# Patient Record
Sex: Female | Born: 1995 | Race: White | Hispanic: No | Marital: Single | Smoking: Never smoker
Health system: Southern US, Community
[De-identification: ages and names within clinical notes are randomized; demographics above are authoritative.]

## PROBLEM LIST (undated history)

## (undated) DIAGNOSIS — F909 Attention-deficit hyperactivity disorder, unspecified type: Secondary | ICD-10-CM

## (undated) HISTORY — PX: WISDOM TOOTH EXTRACTION: SHX21

---

## 2017-03-06 ENCOUNTER — Emergency Department (HOSPITAL_COMMUNITY): Payer: 59

## 2017-03-06 ENCOUNTER — Emergency Department (HOSPITAL_COMMUNITY)
Admission: EM | Admit: 2017-03-06 | Discharge: 2017-03-06 | Disposition: A | Discharge status: Home / Self Care | Payer: 59 | Attending: Emergency Medicine | Admitting: Emergency Medicine

## 2017-03-06 ENCOUNTER — Encounter (HOSPITAL_COMMUNITY): Payer: Self-pay | Admitting: *Deleted

## 2017-03-06 DIAGNOSIS — F909 Attention-deficit hyperactivity disorder, unspecified type: Secondary | ICD-10-CM | POA: Insufficient documentation

## 2017-03-06 DIAGNOSIS — R55 Syncope and collapse: Secondary | ICD-10-CM | POA: Insufficient documentation

## 2017-03-06 DIAGNOSIS — R4182 Altered mental status, unspecified: Secondary | ICD-10-CM | POA: Diagnosis present

## 2017-03-06 HISTORY — DX: Attention-deficit hyperactivity disorder, unspecified type: F90.9

## 2017-03-06 LAB — URINALYSIS, ROUTINE W REFLEX MICROSCOPIC
Bilirubin Urine: NEGATIVE
Glucose, UA: NEGATIVE mg/dL
HGB URINE DIPSTICK: NEGATIVE
Ketones, ur: 20 mg/dL — AB
Leukocytes, UA: NEGATIVE
NITRITE: NEGATIVE
PH: 5 (ref 5.0–8.0)
Protein, ur: NEGATIVE mg/dL
SPECIFIC GRAVITY, URINE: 1.013 (ref 1.005–1.030)

## 2017-03-06 LAB — CBC WITH DIFFERENTIAL/PLATELET
BASOS ABS: 0 10*3/uL (ref 0.0–0.1)
BASOS PCT: 0 %
EOS PCT: 0 %
Eosinophils Absolute: 0 10*3/uL (ref 0.0–0.7)
HCT: 39.7 % (ref 36.0–46.0)
Hemoglobin: 13.7 g/dL (ref 12.0–15.0)
Lymphocytes Relative: 5 %
Lymphs Abs: 1 10*3/uL (ref 0.7–4.0)
MCH: 31.4 pg (ref 26.0–34.0)
MCHC: 34.5 g/dL (ref 30.0–36.0)
MCV: 90.8 fL (ref 78.0–100.0)
MONO ABS: 1 10*3/uL (ref 0.1–1.0)
MONOS PCT: 6 %
Neutro Abs: 16.2 10*3/uL — ABNORMAL HIGH (ref 1.7–7.7)
Neutrophils Relative %: 89 %
PLATELETS: 330 10*3/uL (ref 150–400)
RBC: 4.37 MIL/uL (ref 3.87–5.11)
RDW: 13.3 % (ref 11.5–15.5)
WBC: 18.2 10*3/uL — ABNORMAL HIGH (ref 4.0–10.5)

## 2017-03-06 LAB — BASIC METABOLIC PANEL
Anion gap: 10 (ref 5–15)
BUN: 17 mg/dL (ref 6–20)
CALCIUM: 9.2 mg/dL (ref 8.9–10.3)
CO2: 25 mmol/L (ref 22–32)
CREATININE: 1.03 mg/dL — AB (ref 0.44–1.00)
Chloride: 105 mmol/L (ref 101–111)
GFR calc Af Amer: >60 mL/min (ref >60)
GLUCOSE: 104 mg/dL — AB (ref 65–99)
Potassium: 3.5 mmol/L (ref 3.5–5.1)
Sodium: 140 mmol/L (ref 135–145)

## 2017-03-06 LAB — I-STAT BETA HCG BLOOD, ED (MC, WL, AP ONLY): I-stat hCG, quantitative: <5 m[IU]/mL (ref <5)

## 2017-03-06 LAB — D-DIMER, QUANTITATIVE (NOT AT ARMC): D DIMER QUANT: 0.39 ug{FEU}/mL (ref 0.00–0.50)

## 2017-03-06 NOTE — ED Provider Notes (Signed)
[facility]-EMERGENCY DEPT Provider Note   CSN: [id number] Arrival date & time: 03/06/17  1348     History   Chief Complaint Chief Complaint  Patient presents with  . Altered Mental Status    HPI Veronica Jennings is a 21 y.o. female.  She presents for evaluation of fatigue, and confusion, which occurred while she was engaging in a triathlon.  She had completed the swim and cycle portions, and was within 1 mile of completing the running, when she had to sit down.  She recalls being at an aide station prior to that, but feels like she cannot remember everything that happened there.  She states that she was "confused", and that time.  She feels like she had prepared well enough for this race, and that she had plenty of nutrition and fluids, during the race.  She recalls a water temperature was "cool", and feels that the air temperature was "hot".  There was no loss of consciousness.  She was transferred by EMS without specific treatment.  EMS found her blood sugar to be 79.  Later it increased to 119.  She has some nutrition after the event and she denies vomiting during or after the race.  There was no fall or injury.  She did her usual race preparation, coming into this race.  Her only other concerning problem recently has been a discomfort in her right upper back which she describes as numbness, intermittently, for several months.  She denies preceding headaches, neck pain, focal weakness, abdominal pain, pregnancy, or change in medical treatments.  He is taking her home medication (Vyvanse), as prescribed.  There are no other known modifying factors.  HPI  Past Medical History:  Diagnosis Date  . ADHD     There are no active problems to display for this patient.   Past Surgical History:  Procedure Laterality Date  . WISDOM TOOTH EXTRACTION      OB History    No data available       Home Medications    Prior to Admission medications   Not on File    Family History No family  history on file.  Social History Social History  Substance Use Topics  . Smoking status: Never Smoker  . Smokeless tobacco: Never Used  . Alcohol use No     Allergies   Patient has no known allergies.   Review of Systems Review of Systems  All other systems reviewed and are negative.    Physical Exam Updated Vital Signs BP 108/68   Pulse (!) 101   Temp 98 F (36.7 C) (Oral)   Resp 16   Ht  (1.626 m)   Wt 133 lb (60.3 kg)   LMP 02/24/2017   SpO2 99%   BMI 22.83 kg/m   Physical Exam  Constitutional: She is oriented to person, place, and time. She appears well-developed and well-nourished. No distress.  HENT:  Head: Normocephalic and atraumatic.  Eyes: Conjunctivae and EOM are normal. Pupils are equal, round, and reactive to light.  Neck: Normal range of motion and phonation normal. Neck supple.  Cardiovascular: Normal rate and regular rhythm.   Pulmonary/Chest: Effort normal and breath sounds normal. She exhibits no tenderness.  Abdominal: Soft. She exhibits no distension. There is no tenderness. There is no guarding.  Musculoskeletal: Normal range of motion.  Neurological: She is alert and oriented to person, place, and time. She exhibits normal muscle tone.  No dysarthria or aphasia or nystagmus.  Skin: Skin is  warm and dry.  Psychiatric: She has a normal mood and affect. Her behavior is normal. Judgment and thought content normal.  Nursing note and vitals reviewed.    ED Treatments / Results  Labs (all labs ordered are listed, but only abnormal results are displayed) Labs Reviewed  BASIC METABOLIC PANEL - Abnormal; Notable for the following:       Result Value   Glucose, Bld 104 (*)    Creatinine, Ser 1.03 (*)    All other components within normal limits  CBC WITH DIFFERENTIAL/PLATELET - Abnormal; Notable for the following:    WBC 18.2 (*)    Neutro Abs 16.2 (*)    All other components within normal limits  URINALYSIS, ROUTINE W REFLEX  MICROSCOPIC - Abnormal; Notable for the following:    APPearance CLOUDY (*)    Ketones, ur 20 (*)    All other components within normal limits  D-DIMER, QUANTITATIVE (NOT AT [facility])  I-STAT BETA HCG BLOOD, ED ([facility], [facility], [facility] ONLY)    EKG  EKG Interpretation  Date/Time:  Saturday March 06 2017 15:24:43 EDT Ventricular Rate:  80 PR Interval:    QRS Duration: 96 QT Interval:  395 QTC Calculation: 456 R Axis:   -23 Text Interpretation:  Sinus rhythm Borderline left axis deviation Low voltage, precordial leads No old tracing to compare Confirmed by [facility]  MD, Desiraye Rolfson [phone]) on 03/06/2017 4:04:40 PM       Radiology Dg Chest 2 View  Result Date: 03/06/2017 CLINICAL DATA:  Weakness. Altered mental status well competing an 8 trapped along. EXAM: CHEST  2 VIEW COMPARISON:  None. FINDINGS: The heart size and mediastinal contours are within normal limits. Both lungs are clear. The visualized skeletal structures are unremarkable. IMPRESSION: Negative two view chest x-ray Electronically Signed   By: Marin Roberts M.D.   On: 03/06/2017 15:58    Procedures Procedures (including critical care time)  Medications Ordered in ED Medications - No data to display   Initial Impression / Assessment and Plan / ED Course  I have reviewed the triage vital signs and the nursing notes.  Pertinent labs & imaging results that were available during my care of the patient were reviewed by me and considered in my medical decision making (see chart for details).     Medications - No data to display  Patient Vitals for the past 24 hrs:  BP Temp Temp src Pulse Resp SpO2 Height Weight  03/06/17 1800 108/68 - - (!) 101 - 99 % - -  03/06/17 1745 - - - 91 16 96 % - -  03/06/17 1611 - - - 92 (!) 22 100 % - -  03/06/17 1600 - - - 76 14 100 % - -  03/06/17 1557 - - - 96 (!) 25 99 % - -  03/06/17 1556 - - - 78 15 100 % - -  03/06/17 1555 - - - 70 13 100 % - -  03/06/17 1554 - - - 78 16 100 % - -  03/06/17  1553 - - - 73 15 99 % - -  03/06/17 1552 - - - 83 14 100 % - -  03/06/17 1551 - - - 84 18 99 % - -  03/06/17 1545 - - - 92 18 100 % - -  03/06/17 1533 - - - 91 (!) 24 100 % - -  03/06/17 1522 112/74 - - - - - - -  03/06/17 1356 (!) 107/57 98 F (36.7 C)  Oral (!) 108 18 98 %  (1.626 m) 133 lb (60.3 kg)    At discharge- evaluation with update and discussion. After initial assessment and treatment, an updated evaluation reveals she is amatory easily appears comfortable, and has no further complaints.  Findings discussed with the patient and all questions were answered. Cheree Fowles L    Final Clinical Impressions(s) / ED Diagnoses   Final diagnoses:  Near syncope   Weakness during maximal exercise activity, anticipating in a triathlon.  She had completed a 1500 m swim, 26 mile bike ride, and 5 miles running race, when she had to stop because of feeling fatigued, and confused.  Doubt ACS, PE, pneumonia, metabolic instability, serious bacterial infection.  Differential diagnosis includes acclimatization disorder, and transient hypoglycemia.  Nursing Notes Reviewed/ Care Coordinated Applicable Imaging Reviewed Interpretation of Laboratory Data incorporated into ED treatment  The patient appears reasonably screened and/or stabilized for discharge and I doubt any other medical condition or other [facility] requiring further screening, evaluation, or treatment in the ED at this time prior to discharge.  Plan: Home Medications-OTC, as needed; Home Treatments-rest, fluids, restoration diet; return here if the recommended treatment, does not improve the symptoms; Recommended follow up-PCP, as needed     New Prescriptions There are no discharge medications for this patient.    Mancel Bale, MD 03/06/17 2030

## 2017-03-06 NOTE — ED Notes (Signed)
Pt adds she never lost consciousness but became very fatigued and "hazy." Pt states she drank fluids and ate gummies before ems arrival

## 2017-03-06 NOTE — ED Triage Notes (Signed)
Pt comes in by EMS. Pt was in a triathalon and had finished the bike and swimming section. She was running and became anxious and those around her state she became altered. She never lost consciousness. CBG was 79 upon ems arrival with increase to 119. EMS unsure if she was given anything to drink before they got there. Pt heart rate was 160 upon ems arrival. She is now alert and oriented with NAD noted.

## 2017-03-06 NOTE — Discharge Instructions (Addendum)
Testing today, was reassuring.  There is no sign of cardiac or pulmonary problems to cause your discomfort, during the race.  There is no reason to suspect that you should alter your race preparations, or future race endeavors.

## 2018-05-13 IMAGING — DX DG CHEST 2V
2 series · 2 of 2 positions shown · non-contrast
Comparison: None.

CLINICAL DATA: Weakness. Altered mental status well competing an 8
trapped along.

EXAM:
CHEST  2 VIEW

[chest pa]
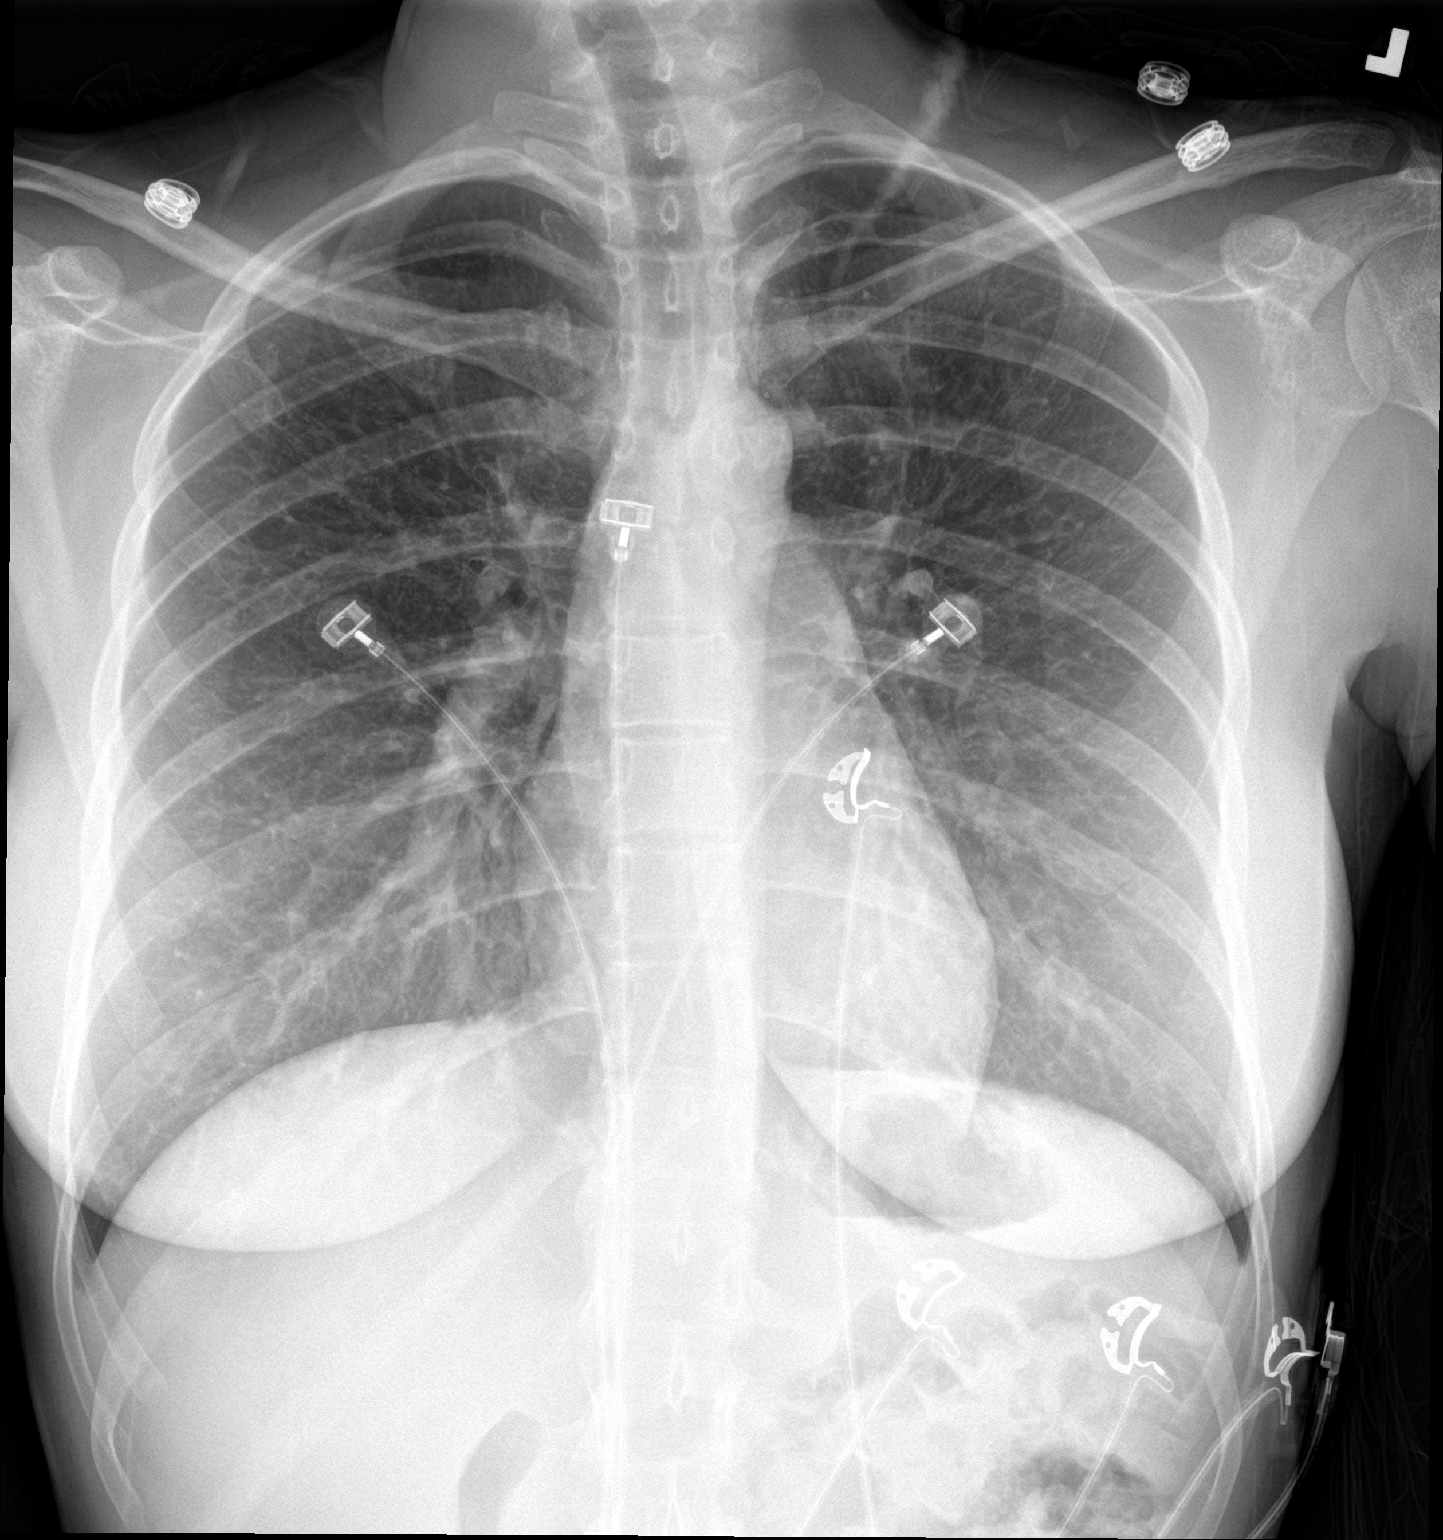

[chest lat]
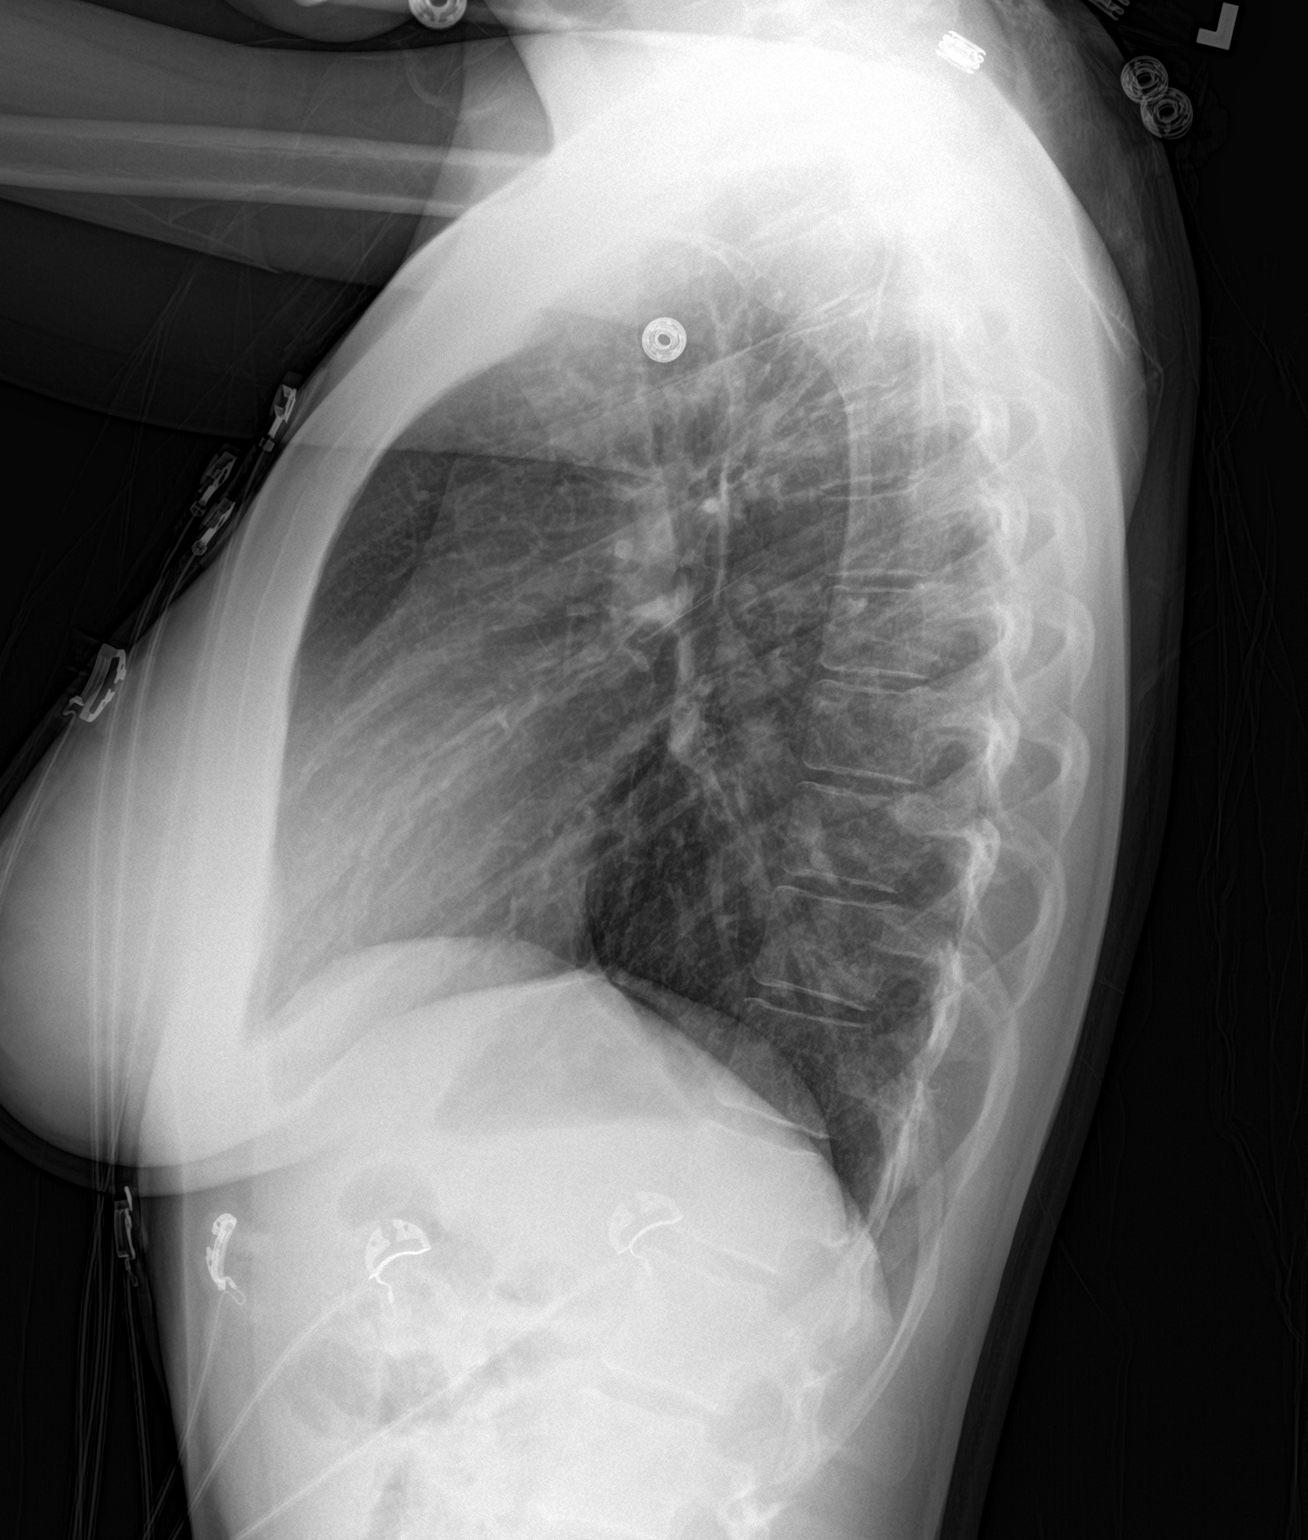

[2 of 2 positions shown; findings below may reference images not displayed]

FINDINGS: The heart size and mediastinal contours are within normal limits.
Both lungs are clear. The visualized skeletal structures are
unremarkable.
IMPRESSION: Negative two view chest x-ray
